# Patient Record
Sex: Female | Born: 1995
Health system: Southern US, Community
[De-identification: ages and names within clinical notes are randomized; demographics above are authoritative.]

## PROBLEM LIST (undated history)

## (undated) DIAGNOSIS — I10 Essential (primary) hypertension: Secondary | ICD-10-CM

## (undated) DIAGNOSIS — F419 Anxiety disorder, unspecified: Secondary | ICD-10-CM

## (undated) DIAGNOSIS — F988 Other specified behavioral and emotional disorders with onset usually occurring in childhood and adolescence: Secondary | ICD-10-CM

## (undated) HISTORY — PX: TONSILLECTOMY: SUR1361

## (undated) HISTORY — DX: Essential (primary) hypertension: I10

## (undated) HISTORY — DX: Other specified behavioral and emotional disorders with onset usually occurring in childhood and adolescence: F98.8

## (undated) HISTORY — DX: Anxiety disorder, unspecified: F41.9

---
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
## 2014-01-14 ENCOUNTER — Other Ambulatory Visit: Payer: Self-pay | Admitting: Gynecology

## 2014-01-21 ENCOUNTER — Other Ambulatory Visit: Payer: Self-pay | Admitting: Gynecology

## 2014-01-23 ENCOUNTER — Telehealth: Payer: Self-pay | Admitting: *Deleted

## 2014-01-23 MED ORDER — NORGESTIMATE-ETH ESTRADIOL 0.25-35 MG-MCG PO TABS: 1.0000 | ORAL_TABLET | Freq: Every day | ORAL | Status: DC

## 2014-01-23 NOTE — Telephone Encounter (Signed)
Pt has annual scheduled on 02/12/14 requesting 1 pack of birth control pill sent. This was done 0 refills. Pt is [employer].

## 2014-02-12 ENCOUNTER — Ambulatory Visit (INDEPENDENT_AMBULATORY_CARE_PROVIDER_SITE_OTHER): Payer: 59 | Admitting: Gynecology

## 2014-02-12 ENCOUNTER — Encounter: Payer: Self-pay | Admitting: Gynecology

## 2014-02-12 VITALS — BP 140/94 | Ht 63.0 in | Wt 118.0 lb

## 2014-02-12 DIAGNOSIS — I10 Essential (primary) hypertension: Secondary | ICD-10-CM | POA: Insufficient documentation

## 2014-02-12 DIAGNOSIS — N898 Other specified noninflammatory disorders of vagina: Secondary | ICD-10-CM

## 2014-02-12 DIAGNOSIS — Z309 Encounter for contraceptive management, unspecified: Secondary | ICD-10-CM

## 2014-02-12 DIAGNOSIS — Z113 Encounter for screening for infections with a predominantly sexual mode of transmission: Secondary | ICD-10-CM

## 2014-02-12 DIAGNOSIS — F988 Other specified behavioral and emotional disorders with onset usually occurring in childhood and adolescence: Secondary | ICD-10-CM | POA: Insufficient documentation

## 2014-02-12 DIAGNOSIS — Z01419 Encounter for gynecological examination (general) (routine) without abnormal findings: Secondary | ICD-10-CM

## 2014-02-12 DIAGNOSIS — F419 Anxiety disorder, unspecified: Secondary | ICD-10-CM | POA: Insufficient documentation

## 2014-02-12 LAB — WET PREP FOR TRICH, YEAST, CLUE: Trich, Wet Prep: NONE SEEN

## 2014-02-12 MED ORDER — NORETHINDRONE ACET-ETHINYL EST 1-20 MG-MCG PO TABS: 1.0000 | ORAL_TABLET | Freq: Every day | ORAL | Status: DC

## 2014-02-12 MED ORDER — NITROFURANTOIN MONOHYD MACRO 100 MG PO CAPS: 100.0000 mg | ORAL_CAPSULE | ORAL | Status: DC

## 2014-02-12 MED ORDER — FLUCONAZOLE 150 MG PO TABS: 150.0000 mg | ORAL_TABLET | Freq: Once | ORAL | Status: DC

## 2014-02-12 NOTE — Progress Notes (Signed)
Carla Barnett 12/22/1996 [id number]        18 y.o.  G0P0 for annual exam.  Several issues noted below.  Past medical history,surgical history, problem list, medications, allergies, family history and social history were all reviewed and documented in the EPIC chart.  ROS:  Performed and pertinent positives and negatives are included in the history, assessment and plan .  Exam: [name] assistant Filed Vitals:   02/12/14 1511  BP: 140/94  Height: 5\' 3"  (1.6 m)  Weight: 118 lb (53.524 kg)   General appearance  Normal Skin grossly normal Head/Neck normal with no cervical or supraclavicular adenopathy thyroid normal Lungs  clear Cardiac RR, without RMG Abdominal  soft, nontender, without masses, organomegaly or hernia Breasts  examined lying and sitting without masses, retractions, discharge or axillary adenopathy. Pelvic  Ext/BUS/vagina  with thick white discharge and generalized erythema.  Cervix  normal with GC/Chlamydia done  Uterus  anteverted, normal size, shape and contour, midline and mobile nontender   Adnexa  Without masses or tenderness    Anus and perineum  Normal     Assessment/Plan:  18 y.o. G0P0 female for annual exam regular menses, oral contraceptives.   1. Birth control. Patient on Loestrin 120 equivalents. Doing well. Has become sexually active since last evaluation. She is being followed for hypertension current blood pressure 140/94. Very strong family history of blood pressure in both her mother at a young age and sister. Reviewed issues of birth control pills and blood pressure with her mother and the patient. Her blood pressure elevations have predated her starting the oral contraceptives. Possible exacerbation of blood pressure and risks to include increased risk of blood clots such as stroke heart attack DVT reviewed. This weighed against risks of pregnancy and risks associated with hypertension in pregnancy. Alternatives to include IUD and nonhormonal options also  reviewed. After a lengthy discussion at this point we all feel comfortable continuing on low-dose oral contraceptive and I refilled her Loestrin 120 equivalents. She is actively being followed by her cardiologist and will continue to follow up with them. They're currently considering starting a blood pressure medication. She's also on Dexedrine for ADD and I reviewed potential side effects of this also with her blood pressure and she is also dealing with this with her primary physician. 2. White discharge. Patients without symptoms of irritation itching or odor. Exam and wet prep consistent with yeast. Will cover with Diflucan 150 mg x1 dose. 3. Post coital UTIs. Patient is having a urinary tract infection every other month or so. Usually is time to follow intercourse. Various strategies to include post coital voiding reviewed. Macrobid 100 mg #30 with one refill provided to take one pill at intercourse. We'll see if this does not prevent future recurrences. 4. STD screening. GC/chlamydia screen done. Need to continue condoms to decrease STD risk stressed. 5. Gardasil series. Mother is unclear whether she completed the whole series. She's going to check with her pediatrician. If not then I recommended patient complete at school health clinic. 6. Breast health. SBE monthly reviewed. 7. Health maintenance. No routine blood work done as she has this done through her primary physician's office. Followup one year, sooner as needed.   Note: This document was prepared with digital dictation and possible smart phrase technology. Any transcriptional errors that result from this process are unintentional.   [name],TIMOTHY P MD, 4:14 PM 02/12/2014

## 2014-02-12 NOTE — Patient Instructions (Signed)
Take one Diflucan pill for yeast infection. Take 1 Macrobid antibiotic after intercourse to prevent a UTI. Continue to followup with her cardiologist in reference to your blood pressure issues. Followup with your mother about the Gardasil series to make sure you got all the shots.

## 2014-02-13 LAB — URINALYSIS W MICROSCOPIC + REFLEX CULTURE
BILIRUBIN URINE: NEGATIVE
Bacteria, UA: NONE SEEN
CRYSTALS: NONE SEEN
Casts: NONE SEEN
GLUCOSE, UA: NEGATIVE mg/dL
Hgb urine dipstick: NEGATIVE
Ketones, ur: NEGATIVE mg/dL
Leukocytes, UA: NEGATIVE
Nitrite: NEGATIVE
Protein, ur: NEGATIVE mg/dL
SPECIFIC GRAVITY, URINE: 1.005 (ref 1.005–1.030)
Squamous Epithelial / HPF: NONE SEEN
Urobilinogen, UA: 0.2 mg/dL (ref 0.0–1.0)
pH: 7 (ref 5.0–8.0)

## 2014-02-13 LAB — GC/CHLAMYDIA PROBE AMP
CT Probe RNA: NEGATIVE
GC Probe RNA: NEGATIVE

## 2015-02-25 ENCOUNTER — Other Ambulatory Visit: Payer: Self-pay | Admitting: Gynecology

## 2015-04-08 ENCOUNTER — Ambulatory Visit (INDEPENDENT_AMBULATORY_CARE_PROVIDER_SITE_OTHER): Payer: BLUE CROSS/BLUE SHIELD | Admitting: Gynecology

## 2015-04-08 ENCOUNTER — Encounter: Payer: Self-pay | Admitting: Gynecology

## 2015-04-08 VITALS — BP 134/84 | Ht 64.0 in | Wt 119.0 lb

## 2015-04-08 DIAGNOSIS — Z01419 Encounter for gynecological examination (general) (routine) without abnormal findings: Secondary | ICD-10-CM

## 2015-04-08 DIAGNOSIS — Z113 Encounter for screening for infections with a predominantly sexual mode of transmission: Secondary | ICD-10-CM | POA: Diagnosis not present

## 2015-04-08 MED ORDER — NITROFURANTOIN MONOHYD MACRO 100 MG PO CAPS: 100.0000 mg | ORAL_CAPSULE | ORAL | Status: DC

## 2015-04-08 MED ORDER — NORETHINDRONE ACET-ETHINYL EST 1-20 MG-MCG PO TABS: 1.0000 | ORAL_TABLET | Freq: Every day | ORAL | Status: DC

## 2015-04-08 NOTE — Patient Instructions (Signed)
Follow up for Nexplanon as scheduled.  Try the boric acid suppositories twice weekly in the vagina to keep yeast infections away..  You may obtain a copy of any labs that were done today by logging onto MyChart as outlined in the instructions provided with your AVS (after visit summary). The office will not call with normal lab results but certainly if there are any significant abnormalities then we will contact you.   Health Maintenance, Female A healthy lifestyle and preventative care can promote health and wellness.  Maintain regular health, dental, and eye exams.  Eat a healthy diet. Foods like vegetables, fruits, whole grains, low-fat dairy products, and lean protein foods contain the nutrients you need without too many calories. Decrease your intake of foods high in solid fats, added sugars, and salt. Get information about a proper diet from your caregiver, if necessary.  Regular physical exercise is one of the most important things you can do for your health. Most adults should get at least 150 minutes of moderate-intensity exercise (any activity that increases your heart rate and causes you to sweat) each week. In addition, most adults need muscle-strengthening exercises on 2 or more days a week.   Maintain a healthy weight. The body mass index (BMI) is a screening tool to identify possible weight problems. It provides an estimate of body fat based on height and weight. Your caregiver can help determine your BMI, and can help you achieve or maintain a healthy weight. For adults 20 years and older:  A BMI below 18.5 is considered underweight.  A BMI of 18.5 to 24.9 is normal.  A BMI of 25 to 29.9 is considered overweight.  A BMI of 30 and above is considered obese.  Maintain normal blood lipids and cholesterol by exercising and minimizing your intake of saturated fat. Eat a balanced diet with plenty of fruits and vegetables. Blood tests for lipids and cholesterol should begin at age  46 and be repeated every 5 years. If your lipid or cholesterol levels are high, you are over 50, or you are a high risk for heart disease, you may need your cholesterol levels checked more frequently.Ongoing high lipid and cholesterol levels should be treated with medicines if diet and exercise are not effective.  If you smoke, find out from your caregiver how to quit. If you do not use tobacco, do not start.  Lung cancer screening is recommended for adults aged 29 80 years who are at high risk for developing lung cancer because of a history of smoking. Yearly low-dose computed tomography (CT) is recommended for people who have at least a 30-pack-year history of smoking and are a current smoker or have quit within the past 15 years. A pack year of smoking is smoking an average of 1 pack of cigarettes a day for 1 year (for example: 1 pack a day for 30 years or 2 packs a day for 15 years). Yearly screening should continue until the smoker has stopped smoking for at least 15 years. Yearly screening should also be stopped for people who develop a health problem that would prevent them from having lung cancer treatment.  If you are pregnant, do not drink alcohol. If you are breastfeeding, be very cautious about drinking alcohol. If you are not pregnant and choose to drink alcohol, do not exceed 1 drink per day. One drink is considered to be 12 ounces (355 mL) of beer, 5 ounces (148 mL) of wine, or 1.5 ounces (44 mL) of liquor.  Avoid use of street drugs. Do not share needles with anyone. Ask for help if you need support or instructions about stopping the use of drugs.  High blood pressure causes heart disease and increases the risk of stroke. Blood pressure should be checked at least every 1 to 2 years. Ongoing high blood pressure should be treated with medicines, if weight loss and exercise are not effective.  If you are 36 to 20 years old, ask your caregiver if you should take aspirin to prevent  strokes.  Diabetes screening involves taking a blood sample to check your fasting blood sugar level. This should be done once every 3 years, after age 30, if you are within normal weight and without risk factors for diabetes. Testing should be considered at a younger age or be carried out more frequently if you are overweight and have at least 1 risk factor for diabetes.  Breast cancer screening is essential preventative care for women. You should practice "breast self-awareness." This means understanding the normal appearance and feel of your breasts and may include breast self-examination. Any changes detected, no matter how small, should be reported to a caregiver. Women in their 63s and 30s should have a clinical breast exam (CBE) by a caregiver as part of a regular health exam every 1 to 3 years. After age 46, women should have a CBE every year. Starting at age 41, women should consider having a mammogram (breast X-ray) every year. Women who have a family history of breast cancer should talk to their caregiver about genetic screening. Women at a high risk of breast cancer should talk to their caregiver about having an MRI and a mammogram every year.  Breast cancer gene (BRCA)-related cancer risk assessment is recommended for women who have family members with BRCA-related cancers. BRCA-related cancers include breast, ovarian, tubal, and peritoneal cancers. Having family members with these cancers may be associated with an increased risk for harmful changes (mutations) in the breast cancer genes BRCA1 and BRCA2. Results of the assessment will determine the need for genetic counseling and BRCA1 and BRCA2 testing.  The Pap test is a screening test for cervical cancer. Women should have a Pap test starting at age 64. Between ages 70 and 95, Pap tests should be repeated every 2 years. Beginning at age 71, you should have a Pap test every 3 years as long as the past 3 Pap tests have been normal. If you had a  hysterectomy for a problem that was not cancer or a condition that could lead to cancer, then you no longer need Pap tests. If you are between ages 56 and 49, and you have had normal Pap tests going back 10 years, you no longer need Pap tests. If you have had past treatment for cervical cancer or a condition that could lead to cancer, you need Pap tests and screening for cancer for at least 20 years after your treatment. If Pap tests have been discontinued, risk factors (such as a new sexual partner) need to be reassessed to determine if screening should be resumed. Some women have medical problems that increase the chance of getting cervical cancer. In these cases, your caregiver may recommend more frequent screening and Pap tests.  The human papillomavirus (HPV) test is an additional test that may be used for cervical cancer screening. The HPV test looks for the virus that can cause the cell changes on the cervix. The cells collected during the Pap test can be tested for HPV. The  HPV test could be used to screen women aged 60 years and older, and should be used in women of any age who have unclear Pap test results. After the age of 62, women should have HPV testing at the same frequency as a Pap test.  Colorectal cancer can be detected and often prevented. Most routine colorectal cancer screening begins at the age of 55 and continues through age 35. However, your caregiver may recommend screening at an earlier age if you have risk factors for colon cancer. On a yearly basis, your caregiver may provide home test kits to check for hidden blood in the stool. Use of a small camera at the end of a tube, to directly examine the colon (sigmoidoscopy or colonoscopy), can detect the earliest forms of colorectal cancer. Talk to your caregiver about this at age 74, when routine screening begins. Direct examination of the colon should be repeated every 5 to 10 years through age 34, unless early forms of pre-cancerous  polyps or small growths are found.  Hepatitis C blood testing is recommended for all people born from [birth year] through 1966 and any individual with known risks for hepatitis C.  Practice safe sex. Use condoms and avoid high-risk sexual practices to reduce the spread of sexually transmitted infections (STIs). Sexually active women aged 39 and younger should be checked for Chlamydia, which is a common sexually transmitted infection. Older women with new or multiple partners should also be tested for Chlamydia. Testing for other STIs is recommended if you are sexually active and at increased risk.  Osteoporosis is a disease in which the bones lose minerals and strength with aging. This can result in serious bone fractures. The risk of osteoporosis can be identified using a bone density scan. Women ages 53 and over and women at risk for fractures or osteoporosis should discuss screening with their caregivers. Ask your caregiver whether you should be taking a calcium supplement or vitamin D to reduce the rate of osteoporosis.  Menopause can be associated with physical symptoms and risks. Hormone replacement therapy is available to decrease symptoms and risks. You should talk to your caregiver about whether hormone replacement therapy is right for you.  Use sunscreen. Apply sunscreen liberally and repeatedly throughout the day. You should seek shade when your shadow is shorter than you. Protect yourself by wearing long sleeves, pants, a wide-brimmed hat, and sunglasses year round, whenever you are outdoors.  Notify your caregiver of new moles or changes in moles, especially if there is a change in shape or color. Also notify your caregiver if a mole is larger than the size of a pencil eraser.  Stay current with your immunizations. Document Released: 06/26/2011 Document Revised: 04/07/2013 Document Reviewed: 06/26/2011 [facility] Patient Information 2014 [place].

## 2015-04-08 NOTE — Progress Notes (Signed)
Carla Barnett 10/04/1996 [id number]        19 y.o.  G0P0 for annual exam.  Several issues noted below.  Past medical history,surgical history, problem list, medications, allergies, family history and social history were all reviewed and documented as reviewed in the EPIC chart.  ROS:  Performed with pertinent positives and negatives included in the history, assessment and plan.   Additional significant findings :  none   Exam: [name] personassistant Filed Vitals:   04/08/15 1526  BP: 134/84  Height: 5\' 4"  (1.626 m)  Weight: 119 lb (53.978 kg)   General appearance:  Normal affect, orientation and appearance. Skin: Grossly normal HEENT: Without gross lesions.  No cervical or supraclavicular adenopathy. Thyroid normal.  Lungs:  Clear without wheezing, rales or rhonchi Cardiac: RR, without RMG Abdominal:  Soft, nontender, without masses, guarding, rebound, organomegaly or hernia Breasts:  Examined lying and sitting without masses, retractions, discharge or axillary adenopathy. Pelvic:  Ext/BUS/vagina normal  Cervix normal. GC/Chlamydia done  Uterus anteverted, normal size, shape and contour, midline and mobile nontender   Adnexa  Without masses or tenderness    Anus and perineum  Normal    Assessment/Plan:  19 y.o. G0P0 female for annual exam with regular menses, oral contraceptives.   1. Birth control. Patient wants to discuss birth control options. Is having issues remembering her birth control pills. Options reviewed to include pill patch and ring Nexplanon Depo-Provera and IUD. The pros/cons, risks/benefits of each choice reviewed. Patient has done a lot of reading and wants to pursue the Nexplanon. The issues of placement and risks as well as irregular bleeding issues reviewed. Patient will schedule with her menses. I refilled her Loestrin 1/20 equivalent 1 year in the event she either changes her mind or scheduling is an issue. 2. Pap smear deferred until next year when she turns  20. 3. STD screening. GC/Chlamydia done. Serum screening reviewed offered and declined. Continued condoms to help decrease STD risk discussed.   4. Gardasil series. Patient believes that she had the Gardasil series. I asked her to call her pediatrician just to verify this and she agrees to do so. 5. Breast health. SBE monthly reviewed. 6. Postcoital UTIs. Uses Macrobid 100 mg after intercourse which seems to prevent her postcoital UTIs. #30 with 1 refill provided. 7. Recurrent yeast infections particularly with exercise. Has been using OTC products. Reviewed various strategies and will try boric acid suppositories 600 mg twice weekly to see if we can suppress her recurrences. 8. Health maintenance. No routine blood work done as patient reports this done at Dr. Lanell MatarAronson's office. Follow up for Nexplanon otherwise annually.     [name],TIMOTHY P MD, 3:57 PM 04/08/2015

## 2015-04-09 ENCOUNTER — Telehealth: Payer: Self-pay | Admitting: *Deleted

## 2015-04-09 LAB — GC/CHLAMYDIA PROBE AMP
CT Probe RNA: NEGATIVE
GC PROBE AMP APTIMA: NEGATIVE

## 2015-04-09 LAB — URINALYSIS W MICROSCOPIC + REFLEX CULTURE
Bilirubin Urine: NEGATIVE
Casts: NONE SEEN
Crystals: NONE SEEN
Glucose, UA: NEGATIVE mg/dL
HGB URINE DIPSTICK: NEGATIVE
Ketones, ur: NEGATIVE mg/dL
Leukocytes, UA: NEGATIVE
Nitrite: NEGATIVE
Protein, ur: NEGATIVE mg/dL
Specific Gravity, Urine: 1.029 (ref 1.005–1.030)
Urobilinogen, UA: 0.2 mg/dL (ref 0.0–1.0)
pH: 6.5 (ref 5.0–8.0)

## 2015-04-09 NOTE — Telephone Encounter (Signed)
-----   Message from Dara Lordsimothy P Fontaine, MD sent at 04/08/2015  4:05 PM EDT ----- I want to prescribe boric acid suppositories 600 mg #30 to use 1 per vagina twice weekly to a pharmacy in [place] for the patient. Continue check to see where that can be done. Let the patient know when prescribed so she knows where to pick it up.

## 2015-04-09 NOTE — Telephone Encounter (Signed)
Pt said she was going to ask her mom and get back with me where to send Rx.

## 2015-04-19 ENCOUNTER — Telehealth: Payer: Self-pay | Admitting: Gynecology

## 2015-04-19 NOTE — Telephone Encounter (Signed)
04/19/15-I LM VM for patient that her BC ins covers the Nexplanon & insertion at 100%, no copay,coinsurance or deductible, for contraception. She already has appt with TF/wl

## 2015-04-20 ENCOUNTER — Encounter: Payer: Self-pay | Admitting: Gynecology

## 2015-04-20 ENCOUNTER — Ambulatory Visit (INDEPENDENT_AMBULATORY_CARE_PROVIDER_SITE_OTHER): Payer: BLUE CROSS/BLUE SHIELD | Admitting: Gynecology

## 2015-04-20 VITALS — BP 130/78

## 2015-04-20 DIAGNOSIS — Z308 Encounter for other contraceptive management: Secondary | ICD-10-CM | POA: Diagnosis not present

## 2015-04-20 DIAGNOSIS — Z30017 Encounter for initial prescription of implantable subdermal contraceptive: Secondary | ICD-10-CM

## 2015-04-20 HISTORY — PX: OTHER SURGICAL HISTORY: SHX169

## 2015-04-20 MED ORDER — ETONOGESTREL 68 MG ~~LOC~~ IMPL: 68.0000 mg | DRUG_IMPLANT | Freq: Once | SUBCUTANEOUS | Status: AC

## 2015-04-20 NOTE — Patient Instructions (Signed)
Etonogestrel implant What is this medicine? ETONOGESTREL (et oh noe JES trel) is a contraceptive (birth control) device. It is used to prevent pregnancy. It can be used for up to 3 years. This medicine may be used for other purposes; ask your health care provider or pharmacist if you have questions. COMMON BRAND NAME(S): Implanon, Nexplanon What should I tell my health care provider before I take this medicine? They need to know if you have any of these conditions: -abnormal vaginal bleeding -blood vessel disease or blood clots -cancer of the breast, cervix, or liver -depression -diabetes -gallbladder disease -headaches -heart disease or recent heart attack -high blood pressure -high cholesterol -kidney disease -liver disease -renal disease -seizures -tobacco smoker -an unusual or allergic reaction to etonogestrel, other hormones, anesthetics or antiseptics, medicines, foods, dyes, or preservatives -pregnant or trying to get pregnant -breast-feeding How should I use this medicine? This device is inserted just under the skin on the inner side of your upper arm by a health care professional. Talk to your pediatrician regarding the use of this medicine in children. Special care may be needed. Overdosage: If you think you've taken too much of this medicine contact a poison control center or emergency room at once. Overdosage: If you think you have taken too much of this medicine contact a poison control center or emergency room at once. NOTE: This medicine is only for you. Do not share this medicine with others. What if I miss a dose? This does not apply. What may interact with this medicine? Do not take this medicine with any of the following medications: -amprenavir -bosentan -fosamprenavir This medicine may also interact with the following medications: -barbiturate medicines for inducing sleep or treating seizures -certain medicines for fungal infections like ketoconazole and  itraconazole -griseofulvin -medicines to treat seizures like carbamazepine, felbamate, oxcarbazepine, phenytoin, topiramate -modafinil -phenylbutazone -rifampin -some medicines to treat HIV infection like atazanavir, indinavir, lopinavir, nelfinavir, tipranavir, ritonavir -St. John's wort This list may not describe all possible interactions. Give your health care provider a list of all the medicines, herbs, non-prescription drugs, or dietary supplements you use. Also tell them if you smoke, drink alcohol, or use illegal drugs. Some items may interact with your medicine. What should I watch for while using this medicine? This product does not protect you against HIV infection (AIDS) or other sexually transmitted diseases. You should be able to feel the implant by pressing your fingertips over the skin where it was inserted. Tell your doctor if you cannot feel the implant. What side effects may I notice from receiving this medicine? Side effects that you should report to your doctor or health care professional as soon as possible: -allergic reactions like skin rash, itching or hives, swelling of the face, lips, or tongue -breast lumps -changes in vision -confusion, trouble speaking or understanding -dark urine -depressed mood -general ill feeling or flu-like symptoms -light-colored stools -loss of appetite, nausea -right upper belly pain -severe headaches -severe pain, swelling, or tenderness in the abdomen -shortness of breath, chest pain, swelling in a leg -signs of pregnancy -sudden numbness or weakness of the face, arm or leg -trouble walking, dizziness, loss of balance or coordination -unusual vaginal bleeding, discharge -unusually weak or tired -yellowing of the eyes or skin Side effects that usually do not require medical attention (Report these to your doctor or health care professional if they continue or are bothersome.): -acne -breast pain -changes in  weight -cough -fever or chills -headache -irregular menstrual bleeding -itching, burning, and   vaginal discharge -pain or difficulty passing urine -sore throat This list may not describe all possible side effects. Call your doctor for medical advice about side effects. You may report side effects to FDA at 1-800-FDA-1088. Where should I keep my medicine? This drug is given in a hospital or clinic and will not be stored at home. NOTE: This sheet is a summary. It may not cover all possible information. If you have questions about this medicine, talk to your doctor, pharmacist, or health care provider.  2015, [employer] Standard. (2012-06-17 15:37:45)  

## 2015-04-20 NOTE — Progress Notes (Signed)
Patient presents for Nexplanon insertion. She previously has been counseled for her contraceptive options and she elects for nexplanon. I reviewed the Nexplanon insertional process and the side effects/risks. I reviewed irregular bleeding, insertion site infections, underlying neurovascular damage with permanent sequela, migration of the implant making removal difficult requiring surgery, the need to have it removed in 3 years under a separate procedure and lastly the risk of failure with pregnancy. Patient is currently on a normal menses and she is right-handed. She has read through and signed the consent form.  Procedure with [name] assistant: Left upper arm examined and marked according to manufacturer's recommendation. The insertion site was cleansed with Betadine solution and the insertional tract infiltrated with 1% lidocaine. The Nexplanon was placed according to manufacturer's recommendation without difficulty. The skin defect was closed with a Steri-Strip. The patient palpated the rod. A pressure dressing was applied and postoperative instructions give her.   Lot number: [id number]     Carla Barnett,Carla Barnett, 2:31 PM 04/20/2015

## 2015-04-23 ENCOUNTER — Encounter: Payer: Self-pay | Admitting: Gynecology

## 2015-09-14 ENCOUNTER — Telehealth: Payer: Self-pay | Admitting: *Deleted

## 2015-09-14 NOTE — Telephone Encounter (Signed)
Okay for Macrodantin 100 mg #30 one by mouth after intercourse refill 5

## 2015-09-14 NOTE — Telephone Encounter (Signed)
Pt called requesting refill on Macrodantin 100 mg Take 1 capsule by mouth after intercourse. Okay to refill?

## 2015-09-15 MED ORDER — NITROFURANTOIN MACROCRYSTAL 100 MG PO CAPS: ORAL_CAPSULE | ORAL | Status: AC

## 2015-09-15 NOTE — Telephone Encounter (Signed)
Pt called back with pharmacy Rx sent

## 2015-09-15 NOTE — Telephone Encounter (Signed)
Left message for pt to call.

## 2017-06-25 ENCOUNTER — Other Ambulatory Visit: Payer: Self-pay | Admitting: Gynecology

## 2017-11-01 ENCOUNTER — Telehealth: Payer: Self-pay | Admitting: *Deleted

## 2017-11-01 NOTE — Telephone Encounter (Signed)
Patient has scheduled on 12/20/17, called requesting refill on Macrobid 100 mg after intercourse. Okay to refill?

## 2017-11-01 NOTE — Telephone Encounter (Signed)
Okay to refill? 

## 2017-11-02 MED ORDER — NITROFURANTOIN MONOHYD MACRO 100 MG PO CAPS: 100.0000 mg | ORAL_CAPSULE | ORAL | 0 refills | Status: DC

## 2017-11-02 NOTE — Telephone Encounter (Signed)
Pt aware, Rx sent. 

## 2017-12-20 ENCOUNTER — Encounter: Payer: BLUE CROSS/BLUE SHIELD | Admitting: Gynecology

## 2018-03-22 ENCOUNTER — Telehealth: Payer: Self-pay | Admitting: *Deleted

## 2018-03-22 ENCOUNTER — Encounter: Payer: Self-pay | Admitting: Gynecology

## 2018-03-22 ENCOUNTER — Ambulatory Visit (INDEPENDENT_AMBULATORY_CARE_PROVIDER_SITE_OTHER): Payer: BLUE CROSS/BLUE SHIELD | Admitting: Gynecology

## 2018-03-22 VITALS — BP 130/84 | Ht 64.0 in | Wt 124.0 lb

## 2018-03-22 DIAGNOSIS — Z309 Encounter for contraceptive management, unspecified: Secondary | ICD-10-CM

## 2018-03-22 DIAGNOSIS — Z01419 Encounter for gynecological examination (general) (routine) without abnormal findings: Secondary | ICD-10-CM

## 2018-03-22 DIAGNOSIS — N898 Other specified noninflammatory disorders of vagina: Secondary | ICD-10-CM

## 2018-03-22 LAB — WET PREP FOR TRICH, YEAST, CLUE

## 2018-03-22 MED ORDER — NONFORMULARY OR COMPOUNDED ITEM: 3 refills | Status: AC

## 2018-03-22 MED ORDER — FLUCONAZOLE 150 MG PO TABS: 150.0000 mg | ORAL_TABLET | Freq: Once | ORAL | 0 refills | Status: AC

## 2018-03-22 NOTE — Patient Instructions (Signed)
Follow-up for Nexplanon replacement appointment.  Take the one Diflucan pill to treat the yeast infection.  Start on the boric acid vaginal suppositories twice weekly to see if it does not suppress recurrences.

## 2018-03-22 NOTE — Addendum Note (Signed)
Addended by: Dayna BarkerGARDNER, Zarrah Loveland K on: 03/22/2018 10:48 AM   Modules accepted: Orders

## 2018-03-22 NOTE — Telephone Encounter (Signed)
Rx called in 

## 2018-03-22 NOTE — Progress Notes (Signed)
    Carla HurterMary D Miceli 09/17/1996 [id number]        22 y.o.  G0P0 for annual gynecologic exam.  Patient also notes an annoying chronic discharge.  No itching irritation or odor.  She does have a history of recurrent yeast infections in the past.  No urinary symptoms such as frequency dysuria urgency low back pain fever or chills.  Past medical history,surgical history, problem list, medications, allergies, family history and social history were all reviewed and documented as reviewed in the EPIC chart.  ROS:  Performed with pertinent positives and negatives included in the history, assessment and plan.   Additional significant findings : None   Exam: Kennon PortelaKim Gardner assistant Vitals:   03/22/18 0953  BP: 130/84  Weight: 124 lb (56.2 kg)  Height: 5\' 4"  (1.626 m)   Body mass index is 21.28 kg/m.  General appearance:  Normal affect, orientation and appearance. Skin: Grossly normal HEENT: Without gross lesions.  No cervical or supraclavicular adenopathy. Thyroid normal.  Lungs:  Clear without wheezing, rales or rhonchi Cardiac: RR, without RMG Abdominal:  Soft, nontender, without masses, guarding, rebound, organomegaly or hernia Breasts:  Examined lying and sitting without masses, retractions, discharge or axillary adenopathy. Pelvic:  Ext, BUS, Vagina: With white discharge.  Cervix: Normal.  Pap smear done  Uterus: Anteverted, normal size, shape and contour, midline and mobile nontender   Adnexa: Without masses or tenderness    Anus and perineum: Normal   Left upper inner extremity with Nexplanon rod palpated  Assessment/Plan:  22 y.o. G0P0 female for annual gynecologic exam with irregular menses, Nexplanon contraception.   1. Vaginal discharge.  Wet prep is negative.  Clinically exam consistent with low-grade yeast.  Given her history will cover with Diflucan 150 mg x1 dose.  We discussed trial of boric acid suppositories in the past but she never started this.  We again discussed boric  acid suppositories as a suppressive treatment and she wants to go ahead and try this.  We will start with boric acid 600 mg vaginal suppositories twice weekly #30 with 2 refills to a compounding pharmacy that she gave me in [place].  She will follow-up if she continues to notice discharge be an issue. 2. Contraceptive management.  Using Nexplanon now.  Having irregular menses with this.  Options for alternatives reviewed to include low-dose oral contraceptives noting she does have a history of elevated blood pressure although relatively normal now at 130/84.  Daily options also discussed.  At this point the patient wants to have her Nexplanon replaced and will do so next month. 3. Breast exam normal today. 4. First Pap smear done 5. STD screening offered and declined. 6. Gardasil series reportedly received in the past. 7. Health maintenance.  No routine lab work done as patient sees a Media plannerprimary physician.  Follow-up for Nexplanon replacement.  Follow-up in 1 year for annual exam.   Dara Lordsimothy P Greidy Sherard MD, 10:15 AM 03/22/2018

## 2018-03-22 NOTE — Telephone Encounter (Signed)
-----   Message from Dara Lordsimothy P Fontaine, MD sent at 03/22/2018 10:29 AM EDT ----- Call into [facility] [place] address is [phone]: Boric acid 600 mg vaginal suppository #31 per vagina twice weekly with 3 refills

## 2018-03-25 LAB — PAP IG W/ RFLX HPV ASCU

## 2018-04-09 ENCOUNTER — Ambulatory Visit: Payer: BLUE CROSS/BLUE SHIELD | Admitting: Gynecology

## 2018-05-01 ENCOUNTER — Ambulatory Visit: Payer: BLUE CROSS/BLUE SHIELD | Admitting: Gynecology

## 2018-05-01 DIAGNOSIS — Z0289 Encounter for other administrative examinations: Secondary | ICD-10-CM

## 2018-06-10 ENCOUNTER — Ambulatory Visit: Payer: BLUE CROSS/BLUE SHIELD | Admitting: Gynecology

## 2018-06-10 DIAGNOSIS — Z0289 Encounter for other administrative examinations: Secondary | ICD-10-CM

## 2018-08-17 ENCOUNTER — Other Ambulatory Visit: Payer: Self-pay | Admitting: Gynecology

## 2018-08-19 NOTE — Telephone Encounter (Signed)
Per note on 04/20/15 "Postcoital UTIs. Uses Macrobid 100 mg after intercourse which seems to prevent her postcoital UTIs. #30 with 1 refill provided."  Was last prescribed on 11/01/17 via phone call.

## 2019-09-15 ENCOUNTER — Encounter: Payer: Self-pay | Admitting: Gynecology
